# Patient Record
Sex: Male | Born: 1960 | Race: Black or African American | Hispanic: No | Marital: Married | Smoking: Current every day smoker
Health system: Southern US, Community
[De-identification: ages and names within clinical notes are randomized; demographics above are authoritative.]

## PROBLEM LIST (undated history)

## (undated) DIAGNOSIS — E785 Hyperlipidemia, unspecified: Secondary | ICD-10-CM

## (undated) DIAGNOSIS — I10 Essential (primary) hypertension: Secondary | ICD-10-CM

## (undated) DIAGNOSIS — E119 Type 2 diabetes mellitus without complications: Secondary | ICD-10-CM

## (undated) HISTORY — PX: NO PAST SURGERIES: SHX2092

---

## 2007-03-13 ENCOUNTER — Emergency Department: Payer: Self-pay | Admitting: Emergency Medicine

## 2011-11-21 ENCOUNTER — Ambulatory Visit: Payer: Self-pay | Admitting: Internal Medicine

## 2013-04-14 ENCOUNTER — Emergency Department: Payer: Self-pay | Admitting: Emergency Medicine

## 2013-06-13 IMAGING — CR DG CHEST 2V
1 series · 2 of 2 positions shown · non-contrast
Comparison: none

REASON FOR EXAM: cough and congestion for several days.
COMMENTS:

PROCEDURE:     MDR - MDR CHEST PA(OR AP) AND LATERAL  - November 21, 2011  [DATE]
RESULT:     The lungs are clear. The cardiac silhouette and visualized bony
skeleton are unremarkable.

[Series 1: pa · 0.17mm/px · 2 of 2 slices shown]
[im 1/2]
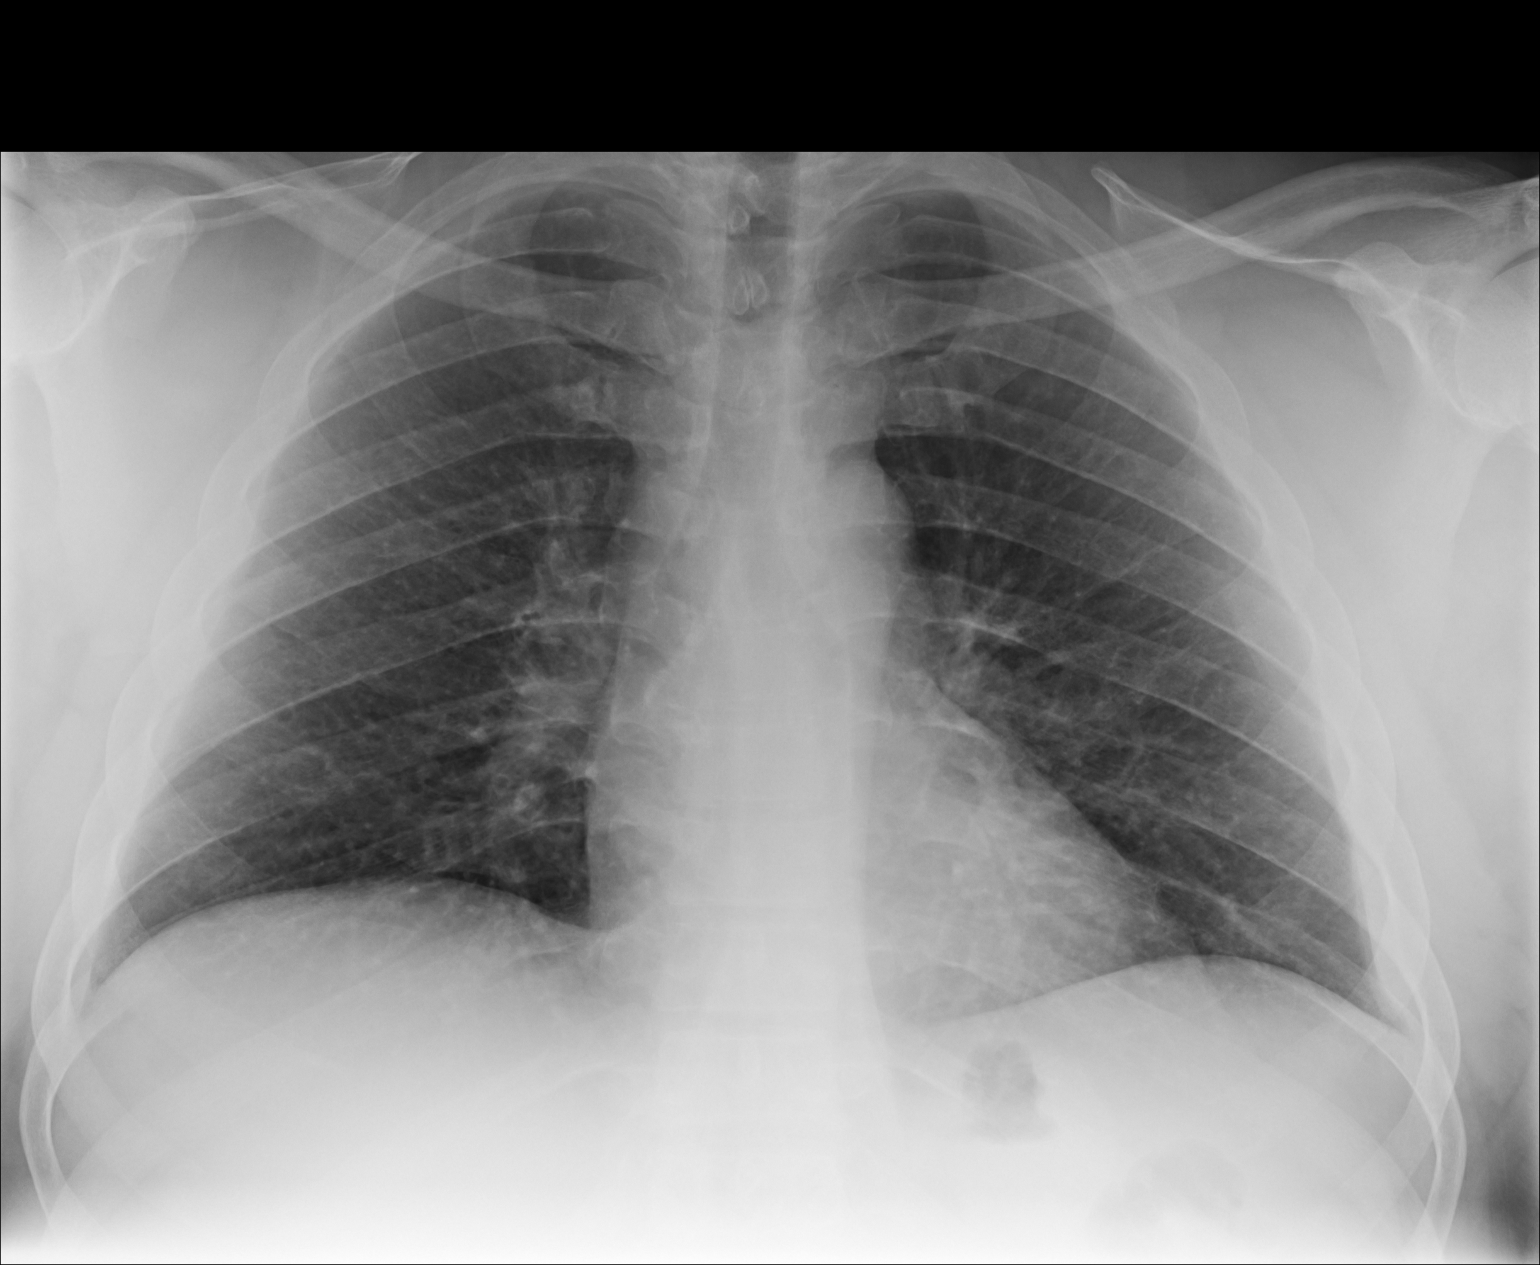
[im 2/2]
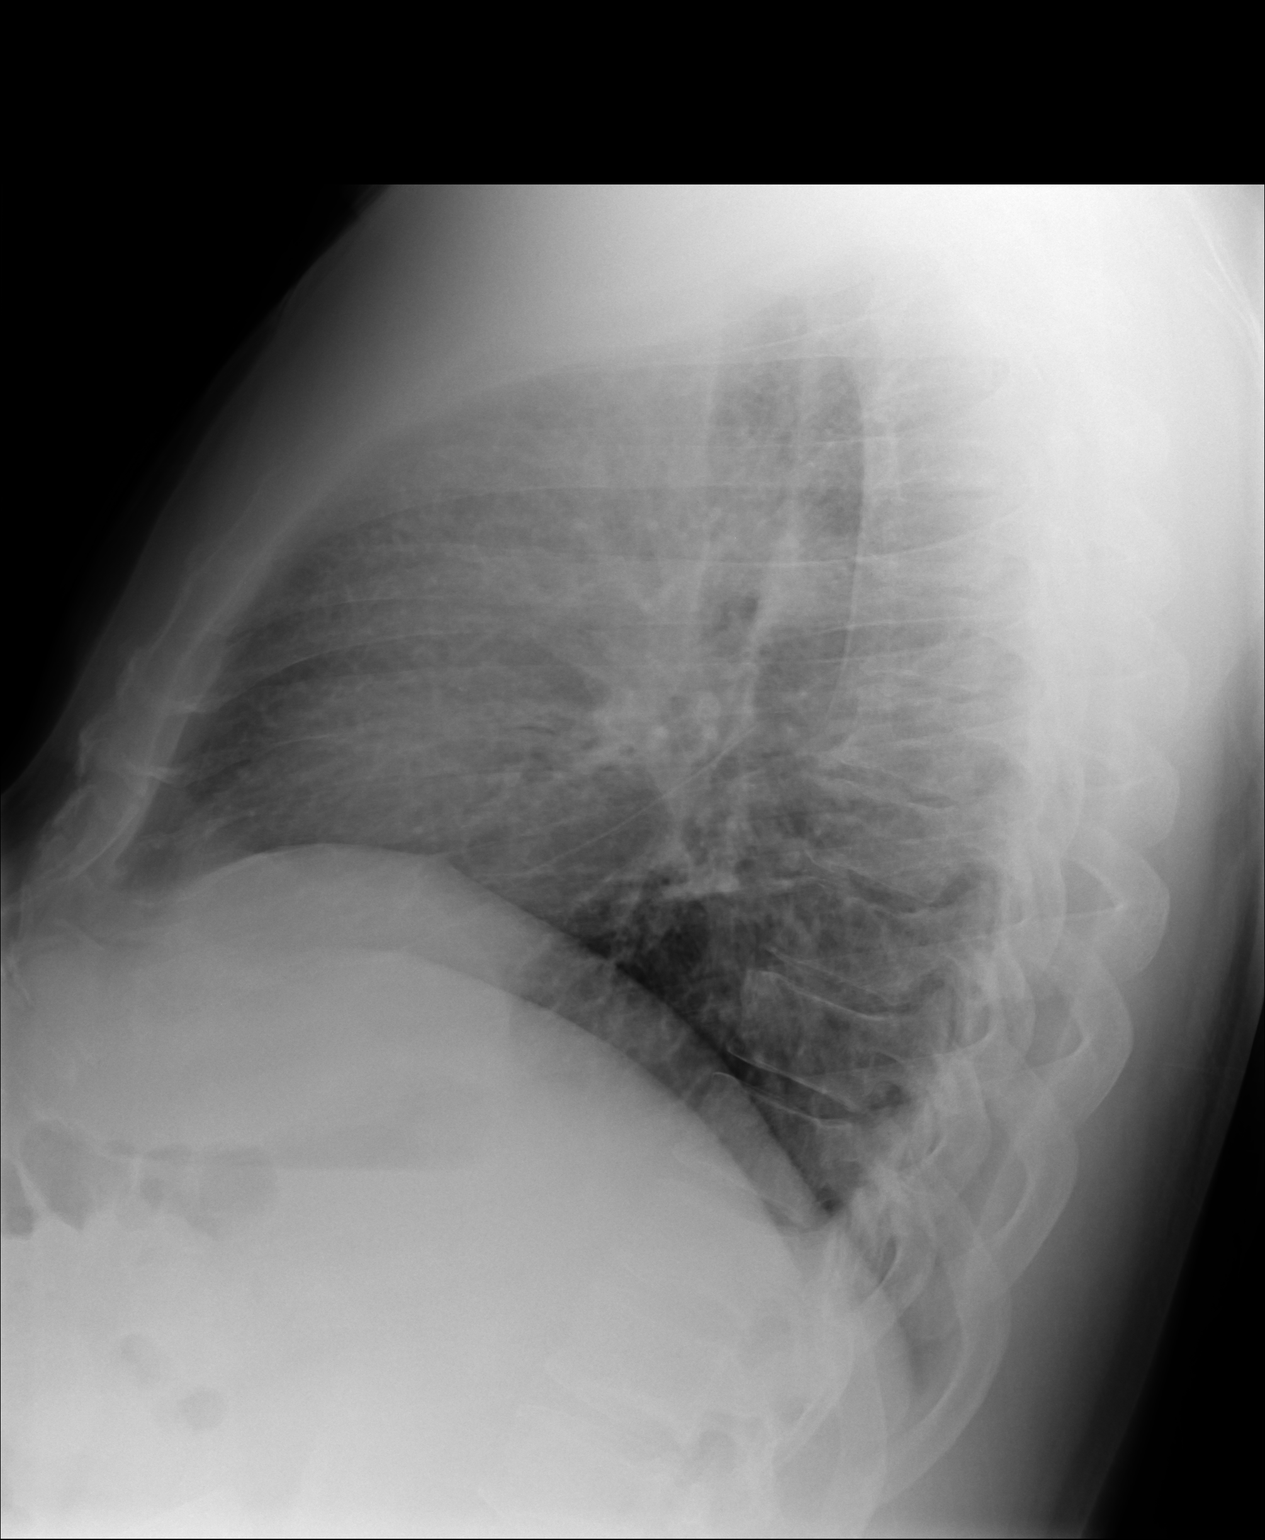

[2 of 2 positions shown; findings below may reference images not displayed]

IMPRESSION: 1. Chest radiograph without evidence of acute cardiopulmonary disease.

## 2014-11-13 ENCOUNTER — Ambulatory Visit
Admit: 2014-11-13 | Disposition: A | Discharge status: Home / Self Care | Payer: Self-pay | Attending: Family Medicine | Admitting: Family Medicine

## 2014-12-13 ENCOUNTER — Ambulatory Visit
Admit: 2014-12-13 | Disposition: A | Discharge status: Home / Self Care | Payer: Self-pay | Attending: Family Medicine | Admitting: Family Medicine

## 2020-07-29 ENCOUNTER — Other Ambulatory Visit: Payer: Self-pay

## 2020-07-29 ENCOUNTER — Ambulatory Visit (INDEPENDENT_AMBULATORY_CARE_PROVIDER_SITE_OTHER): Payer: BC Managed Care – PPO

## 2020-07-29 ENCOUNTER — Ambulatory Visit
Admission: EM | Admit: 2020-07-29 | Discharge: 2020-07-29 | Disposition: A | Discharge status: Home / Self Care | Payer: BC Managed Care – PPO

## 2020-07-29 DIAGNOSIS — M25512 Pain in left shoulder: Secondary | ICD-10-CM | POA: Diagnosis not present

## 2020-07-29 DIAGNOSIS — S40012A Contusion of left shoulder, initial encounter: Secondary | ICD-10-CM

## 2020-07-29 HISTORY — DX: Hyperlipidemia, unspecified: E78.5

## 2020-07-29 HISTORY — DX: Type 2 diabetes mellitus without complications: E11.9

## 2020-07-29 HISTORY — DX: Essential (primary) hypertension: I10
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
## 2020-07-29 NOTE — Discharge Instructions (Addendum)
Your x-rays did not show any broken bones.  I recommend using over-the-counter ibuprofen and Tylenol as needed for pain.  Apply moist heat for 20 minutes at a time 2-3 times a day as needed for pain control.  The easiest way to treat with moist heat is to take a hand towel and run under hot tap water, wring it out, and late of your shoulder and apply heating pad to the top of it.  Do not leave in place for more than 20 minutes.  If your pain continues follow-up with your primary care provider.
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
## 2022-06-10 ENCOUNTER — Other Ambulatory Visit: Payer: Self-pay | Admitting: Family Medicine

## 2022-06-10 DIAGNOSIS — Z87891 Personal history of nicotine dependence: Secondary | ICD-10-CM

## 2022-06-10 DIAGNOSIS — F17200 Nicotine dependence, unspecified, uncomplicated: Secondary | ICD-10-CM

## 2022-06-23 ENCOUNTER — Ambulatory Visit
Admission: RE | Admit: 2022-06-23 | Discharge: 2022-06-23 | Disposition: A | Discharge status: Home / Self Care | Payer: BC Managed Care – PPO | Source: Ambulatory Visit | Attending: Family Medicine | Admitting: Family Medicine

## 2022-06-23 DIAGNOSIS — Z87891 Personal history of nicotine dependence: Secondary | ICD-10-CM | POA: Insufficient documentation

## 2022-06-23 DIAGNOSIS — F17200 Nicotine dependence, unspecified, uncomplicated: Secondary | ICD-10-CM | POA: Diagnosis present

## 2024-10-16 ENCOUNTER — Encounter: Payer: Self-pay | Admitting: Emergency Medicine

## 2024-10-16 ENCOUNTER — Ambulatory Visit
Admission: EM | Admit: 2024-10-16 | Discharge: 2024-10-16 | Disposition: A | Discharge status: Home / Self Care | Source: Home / Self Care

## 2024-10-16 DIAGNOSIS — N390 Urinary tract infection, site not specified: Secondary | ICD-10-CM

## 2024-10-16 DIAGNOSIS — R6889 Other general symptoms and signs: Secondary | ICD-10-CM | POA: Diagnosis not present

## 2024-10-16 DIAGNOSIS — J111 Influenza due to unidentified influenza virus with other respiratory manifestations: Secondary | ICD-10-CM

## 2024-10-16 DIAGNOSIS — R82998 Other abnormal findings in urine: Secondary | ICD-10-CM | POA: Diagnosis not present

## 2024-10-16 LAB — POCT INFLUENZA A/B
Influenza A, POC: NEGATIVE
Influenza B, POC: NEGATIVE

## 2024-10-16 LAB — POCT URINE DIPSTICK
Glucose, UA: NEGATIVE mg/dL
Ketones, POC UA: NEGATIVE mg/dL
Leukocytes, UA: NEGATIVE
Nitrite, UA: POSITIVE — AB
Protein Ur, POC: >=300 mg/dL — AB
Spec Grav, UA: >=1.03 — AB
Urobilinogen, UA: >=8 U/dL — AB
pH, UA: 5.5

## 2024-10-16 LAB — POC SOFIA SARS ANTIGEN FIA: SARS Coronavirus 2 Ag: NEGATIVE

## 2024-10-16 MED ORDER — PROMETHAZINE-DM 6.25-15 MG/5ML PO SYRP: 5.0000 mL | ORAL_SOLUTION | Freq: Four times a day (QID) | ORAL | 0 refills | Status: AC | PRN

## 2024-10-16 MED ORDER — CEPHALEXIN 500 MG PO CAPS: 500.0000 mg | ORAL_CAPSULE | Freq: Three times a day (TID) | ORAL | 0 refills | Status: AC

## 2024-10-16 MED ORDER — IPRATROPIUM BROMIDE 0.06 % NA SOLN: 2.0000 | Freq: Four times a day (QID) | NASAL | 12 refills | Status: AC

## 2024-10-16 MED ORDER — BENZONATATE 100 MG PO CAPS: 200.0000 mg | ORAL_CAPSULE | Freq: Three times a day (TID) | ORAL | 0 refills | Status: AC

## 2024-10-18 ENCOUNTER — Ambulatory Visit (HOSPITAL_COMMUNITY): Payer: Self-pay

## 2024-10-18 LAB — URINE CULTURE
Culture: NO GROWTH
Special Requests: NORMAL
